# Patient Record
Sex: Female | Born: 1995 | Race: White | Hispanic: No | Marital: Single | Smoking: Never smoker
Health system: Southern US, Community
[De-identification: ages and names within clinical notes are randomized; demographics above are authoritative.]

## PROBLEM LIST (undated history)

## (undated) DIAGNOSIS — N83209 Unspecified ovarian cyst, unspecified side: Secondary | ICD-10-CM

## (undated) HISTORY — DX: Unspecified ovarian cyst, unspecified side: N83.209

---
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
## 2014-11-19 ENCOUNTER — Emergency Department (HOSPITAL_COMMUNITY)
Admission: EM | Admit: 2014-11-19 | Discharge: 2014-11-19 | Disposition: A | Discharge status: Home / Self Care | Payer: Federal, State, Local not specified - PPO | Attending: Emergency Medicine | Admitting: Emergency Medicine

## 2014-11-19 ENCOUNTER — Encounter (HOSPITAL_COMMUNITY): Payer: Self-pay | Admitting: Emergency Medicine

## 2014-11-19 ENCOUNTER — Emergency Department (HOSPITAL_COMMUNITY): Payer: Federal, State, Local not specified - PPO

## 2014-11-19 DIAGNOSIS — N830 Follicular cyst of ovary: Secondary | ICD-10-CM | POA: Insufficient documentation

## 2014-11-19 DIAGNOSIS — R1031 Right lower quadrant pain: Secondary | ICD-10-CM

## 2014-11-19 DIAGNOSIS — R1030 Lower abdominal pain, unspecified: Secondary | ICD-10-CM | POA: Diagnosis present

## 2014-11-19 DIAGNOSIS — R000 Tachycardia, unspecified: Secondary | ICD-10-CM | POA: Insufficient documentation

## 2014-11-19 DIAGNOSIS — N83201 Unspecified ovarian cyst, right side: Secondary | ICD-10-CM

## 2014-11-19 DIAGNOSIS — Z3202 Encounter for pregnancy test, result negative: Secondary | ICD-10-CM | POA: Insufficient documentation

## 2014-11-19 LAB — URINALYSIS, ROUTINE W REFLEX MICROSCOPIC
Bilirubin Urine: NEGATIVE
Glucose, UA: NEGATIVE mg/dL
Hgb urine dipstick: NEGATIVE
Ketones, ur: >80 mg/dL — AB
LEUKOCYTES UA: NEGATIVE
NITRITE: NEGATIVE
PH: 6 (ref 5.0–8.0)
Protein, ur: NEGATIVE mg/dL
SPECIFIC GRAVITY, URINE: 1.027 (ref 1.005–1.030)
Urobilinogen, UA: 1 mg/dL (ref 0.0–1.0)

## 2014-11-19 LAB — COMPREHENSIVE METABOLIC PANEL
ALT: 13 U/L — ABNORMAL LOW (ref 14–54)
ANION GAP: 9 (ref 5–15)
AST: 23 U/L (ref 15–41)
Albumin: 4.4 g/dL (ref 3.5–5.0)
Alkaline Phosphatase: 58 U/L (ref 38–126)
BUN: 7 mg/dL (ref 6–20)
CHLORIDE: 105 mmol/L (ref 101–111)
CO2: 25 mmol/L (ref 22–32)
Calcium: 9.7 mg/dL (ref 8.9–10.3)
Creatinine, Ser: 0.69 mg/dL (ref 0.44–1.00)
Glucose, Bld: 100 mg/dL — ABNORMAL HIGH (ref 65–99)
POTASSIUM: 3.6 mmol/L (ref 3.5–5.1)
Sodium: 139 mmol/L (ref 135–145)
Total Bilirubin: 1 mg/dL (ref 0.3–1.2)
Total Protein: 7.7 g/dL (ref 6.5–8.1)

## 2014-11-19 LAB — WET PREP, GENITAL
Trich, Wet Prep: NONE SEEN
YEAST WET PREP: NONE SEEN

## 2014-11-19 LAB — CBC
HEMATOCRIT: 39.2 % (ref 36.0–46.0)
HEMOGLOBIN: 13.4 g/dL (ref 12.0–15.0)
MCH: 31.6 pg (ref 26.0–34.0)
MCHC: 34.2 g/dL (ref 30.0–36.0)
MCV: 92.5 fL (ref 78.0–100.0)
Platelets: 195 10*3/uL (ref 150–400)
RBC: 4.24 MIL/uL (ref 3.87–5.11)
RDW: 12.2 % (ref 11.5–15.5)
WBC: 11.5 10*3/uL — AB (ref 4.0–10.5)

## 2014-11-19 LAB — LIPASE, BLOOD: Lipase: 109 U/L — ABNORMAL HIGH (ref 22–51)

## 2014-11-19 LAB — I-STAT BETA HCG BLOOD, ED (MC, WL, AP ONLY)

## 2014-11-19 MED ORDER — HYDROCODONE-ACETAMINOPHEN 5-325 MG PO TABS: 1.0000 | ORAL_TABLET | Freq: Four times a day (QID) | ORAL | Status: DC | PRN

## 2014-11-19 MED ORDER — IBUPROFEN 600 MG PO TABS: 600.0000 mg | ORAL_TABLET | Freq: Four times a day (QID) | ORAL | Status: DC | PRN

## 2014-11-19 MED ORDER — MORPHINE SULFATE (PF) 4 MG/ML IV SOLN: 5.0000 mg | Freq: Once | INTRAVENOUS | Status: DC

## 2014-11-19 MED ORDER — MORPHINE SULFATE (PF) 4 MG/ML IV SOLN
4.0000 mg | Freq: Once | INTRAVENOUS | Status: AC
  Administered 2014-11-19: 4 mg via INTRAVENOUS
  Filled 2014-11-19: qty 1

## 2014-11-19 MED ORDER — ONDANSETRON HCL 4 MG/2ML IJ SOLN
4.0000 mg | Freq: Once | INTRAMUSCULAR | Status: AC
  Administered 2014-11-19: 4 mg via INTRAVENOUS
  Filled 2014-11-19: qty 2

## 2014-11-19 MED ORDER — ONDANSETRON 4 MG PO TBDP: 4.0000 mg | ORAL_TABLET | Freq: Three times a day (TID) | ORAL | Status: DC | PRN

## 2014-11-19 NOTE — ED Notes (Signed)
Patient states she was having sex last night when she started having intense RLQ pain that lasted over an hour and caused her to have nausea, vomiting and diarrhea.  Patient states has eased off some at this time.   Patient denies other symptoms.

## 2014-11-19 NOTE — ED Notes (Signed)
[place] called and state that someone is coming to [facility] to [place] pt.

## 2014-11-19 NOTE — ED Provider Notes (Signed)
Patient presented to the ER with right lower quadrant and pelvic pain. Pain started suddenly during intercourse last night.  Face to face Exam: HEENT - PERRLA Lungs - CTAB Heart - RRR, no M/R/G Abd - S/NT/ND no tenderness at McBurney's point. Neuro - alert, oriented x3  Plan: Patient presents with pain and mild tenderness in the right lower pelvic area, not at McBurney's point. Symptoms began suddenly while having intercourse. Will perform pelvic exam to evaluate for infectious etiology. Ultrasound to rule out torsion.  Gilda Crease, MD 11/19/14 1945

## 2014-11-19 NOTE — ED Provider Notes (Signed)
CSN: [id number]     Arrival date & time 11/19/14  1048 History   First MD Initiated Contact with Patient 11/19/14 1716     Chief Complaint  Patient presents with  . Abdominal Pain    Patient is a 19 y.o. female presenting with general illness. The history is provided by the patient. No language interpreter was used.  Illness Location:  RLQ/Suprapubic Quality:  Pain Severity:  Severe Onset quality:  Sudden Timing:  Constant Progression:  Partially resolved Chronicity:  New Context:  PMHx presenting with abdominal pain. Onset yesterday evening at 10pm during sexual intercourse. Patient had hip flexed and hyper externally rotated with sudden onset  RLQ pain. Denies inguinal or thigh pain. Associated w/ N/V/D. Patient ate pizza bites for dinner that evening & boyfriend reportedly ate same without summer symptoms. Emesis NBNB. Diarrhea loose and non-bloody. Denies fever, chills, cough, SOB, CP, hematochezia, hematemesis, hematuria, dysuria, urinary frequency, urinary urgency, VB, vag d/c Associated symptoms: abdominal pain, diarrhea, nausea and vomiting   Associated symptoms: no chest pain, no congestion, no cough, no fever, no headaches, no rhinorrhea and no shortness of breath     History reviewed. No pertinent past medical history. History reviewed. No pertinent past surgical history. No family history on file. Social History  Substance Use Topics  . Smoking status: Never Smoker   . Smokeless tobacco: None  . Alcohol Use: No   OB History    No data available      Review of Systems  Constitutional: Negative for fever and chills.  HENT: Negative for congestion and rhinorrhea.   Respiratory: Negative for cough and shortness of breath.   Cardiovascular: Negative for chest pain.  Gastrointestinal: Positive for nausea, vomiting, abdominal pain and diarrhea. Negative for constipation, blood in stool, abdominal distention, anal bleeding and rectal pain.  Genitourinary: Negative for  dysuria, urgency, frequency and hematuria.  Neurological: Negative for headaches.  All other systems reviewed and are negative.   Allergies  Review of patient's allergies indicates no known allergies.  Home Medications   Prior to Admission medications   Medication Sig Start Date End Date Taking? Authorizing Provider  HYDROcodone-acetaminophen (NORCO/VICODIN) 5-325 MG per tablet Take 1 tablet by mouth every 6 (six) hours as needed. 11/19/14   Angelina Ok, MD  ibuprofen (ADVIL,MOTRIN) 600 MG tablet Take 1 tablet (600 mg total) by mouth every 6 (six) hours as needed. 11/19/14   Angelina Ok, MD  ondansetron (ZOFRAN ODT) 4 MG disintegrating tablet Take 1 tablet (4 mg total) by mouth every 8 (eight) hours as needed for nausea or vomiting. 11/19/14   Angelina Ok, MD   BP 122/71 mmHg  Pulse 87  Temp(Src) 98.6 F (37 C) (Oral)  Resp 16  Ht 5\' 4"  (1.626 m)  Wt 120 lb (54.432 kg)  BMI 20.59 kg/m2  SpO2 96%  LMP 11/01/2014   Physical Exam  Constitutional: She is oriented to person, place, and time. She appears well-developed and well-nourished. She appears distressed.  HENT:  Head: Normocephalic and atraumatic.  Eyes: Conjunctivae are normal. Pupils are equal, round, and reactive to light.  Neck: Normal range of motion.  Cardiovascular: Regular rhythm and intact distal pulses.  Tachycardia present.   Pulmonary/Chest: Effort normal and breath sounds normal. No respiratory distress. She has no wheezes.  Abdominal: Soft. Bowel sounds are normal. She exhibits no distension. There is tenderness (suprapubic region, RLQ). There is no rebound and no guarding.  Genitourinary:  GU exam notable for no blood in vault or  at cervical os, no cervical lesions, no CMT, no adnexal tenderness.  Musculoskeletal: Normal range of motion.  Neurological: She is alert and oriented to person, place, and time.  Skin: Skin is warm and dry. She is not diaphoretic.  Nursing note and vitals reviewed.   ED  Course  Procedures   Labs Review Labs Reviewed  WET PREP, GENITAL - Abnormal; Notable for the following:    Clue Cells Wet Prep HPF POC FEW (*)    WBC, Wet Prep HPF POC FEW (*)    All other components within normal limits  LIPASE, BLOOD - Abnormal; Notable for the following:    Lipase 109 (*)    All other components within normal limits  COMPREHENSIVE METABOLIC PANEL - Abnormal; Notable for the following:    Glucose, Bld 100 (*)    ALT 13 (*)    All other components within normal limits  CBC - Abnormal; Notable for the following:    WBC 11.5 (*)    All other components within normal limits  URINALYSIS, ROUTINE W REFLEX MICROSCOPIC (NOT AT [facility]) - Abnormal; Notable for the following:    Color, Urine AMBER (*)    Ketones, ur >80 (*)    All other components within normal limits  I-STAT BETA HCG BLOOD, ED ([facility], [facility], [facility] ONLY)  GC/CHLAMYDIA PROBE AMP ([place]) NOT AT [facility]   Imaging Review US Transvaginal Non-ob  11/19/2014   CLINICAL DATA:  Right lower quadrant pain  EXAM: TRANSABDOMINAL AND TRANSVAGINAL ULTRASOUND OF PELVIS  DOPPLER ULTRASOUND OF OVARIES  TECHNIQUE: Both transabdominal and transvaginal ultrasound examinations of the pelvis were performed. Transabdominal technique was performed for global imaging of the pelvis including uterus, ovaries, adnexal regions, and pelvic cul-de-sac.  It was necessary to proceed with endovaginal exam following the transabdominal exam to visualize the uterus and bilateral ovaries. Color and duplex Doppler ultrasound was utilized to evaluate blood flow to the ovaries.  COMPARISON:  None.  FINDINGS: Uterus  Measurements: 7.1 x 4.1 x 5.3 cm. No fibroids or other mass visualized.  Endometrium  Thickness: 11 mm.  No focal abnormality visualized.  Right ovary  Measurements: 3.7 x 3.5 x 4.8 cm. 3.1 x 2.3 x 2.8 cm corpus luteal cyst.  Left ovary  Measurements: 3.5 x 2.1 x 2.2 cm. Normal appearance/no adnexal mass.  Pulsed Doppler evaluation of both ovaries  demonstrates normal low-resistance arterial and venous waveforms.  Other findings  No free fluid.  IMPRESSION: 3.1 cm right corpus luteal cyst.  Otherwise negative pelvic ultrasound.  No evidence of ovarian torsion.   Electronically Signed   By: Charline Bills M.D.   On: 11/19/2014 21:03   US Pelvis Complete  11/19/2014   CLINICAL DATA:  Right lower quadrant pain  EXAM: TRANSABDOMINAL AND TRANSVAGINAL ULTRASOUND OF PELVIS  DOPPLER ULTRASOUND OF OVARIES  TECHNIQUE: Both transabdominal and transvaginal ultrasound examinations of the pelvis were performed. Transabdominal technique was performed for global imaging of the pelvis including uterus, ovaries, adnexal regions, and pelvic cul-de-sac.  It was necessary to proceed with endovaginal exam following the transabdominal exam to visualize the uterus and bilateral ovaries. Color and duplex Doppler ultrasound was utilized to evaluate blood flow to the ovaries.  COMPARISON:  None.  FINDINGS: Uterus  Measurements: 7.1 x 4.1 x 5.3 cm. No fibroids or other mass visualized.  Endometrium  Thickness: 11 mm.  No focal abnormality visualized.  Right ovary  Measurements: 3.7 x 3.5 x 4.8 cm. 3.1 x 2.3 x 2.8 cm corpus luteal  cyst.  Left ovary  Measurements: 3.5 x 2.1 x 2.2 cm. Normal appearance/no adnexal mass.  Pulsed Doppler evaluation of both ovaries demonstrates normal low-resistance arterial and venous waveforms.  Other findings  No free fluid.  IMPRESSION: 3.1 cm right corpus luteal cyst.  Otherwise negative pelvic ultrasound.  No evidence of ovarian torsion.   Electronically Signed   By: Charline Bills M.D.   On: 11/19/2014 21:03   [place] Art/ven Flow Abd Pelv Doppler  11/19/2014   CLINICAL DATA:  Right lower quadrant pain  EXAM: TRANSABDOMINAL AND TRANSVAGINAL ULTRASOUND OF PELVIS  DOPPLER ULTRASOUND OF OVARIES  TECHNIQUE: Both transabdominal and transvaginal ultrasound examinations of the pelvis were performed. Transabdominal technique was performed for global  imaging of the pelvis including uterus, ovaries, adnexal regions, and pelvic cul-de-sac.  It was necessary to proceed with endovaginal exam following the transabdominal exam to visualize the uterus and bilateral ovaries. Color and duplex Doppler ultrasound was utilized to evaluate blood flow to the ovaries.  COMPARISON:  None.  FINDINGS: Uterus  Measurements: 7.1 x 4.1 x 5.3 cm. No fibroids or other mass visualized.  Endometrium  Thickness: 11 mm.  No focal abnormality visualized.  Right ovary  Measurements: 3.7 x 3.5 x 4.8 cm. 3.1 x 2.3 x 2.8 cm corpus luteal cyst.  Left ovary  Measurements: 3.5 x 2.1 x 2.2 cm. Normal appearance/no adnexal mass.  Pulsed Doppler evaluation of both ovaries demonstrates normal low-resistance arterial and venous waveforms.  Other findings  No free fluid.  IMPRESSION: 3.1 cm right corpus luteal cyst.  Otherwise negative pelvic ultrasound.  No evidence of ovarian torsion.   Electronically Signed   By: Charline Bills M.D.   On: 11/19/2014 21:03   I have personally reviewed and evaluated these images and lab results as part of my medical decision-making.   EKG Interpretation None      MDM  Ms. Bensen is a 19 yo female w/ no notable PMHx presenting with abdominal pain. Onset yesterday evening at 10pm during sexual intercourse. Patient had hip flexed and hyper externally rotated with sudden onset  RLQ pain. Denies inguinal or thigh pain. Associated w/ N/V/D. Patient ate pizza bites for dinner that evening & boyfriend reportedly ate same without summer symptoms. Emesis NBNB. Diarrhea loose and non-bloody. Denies fever, chills, cough, SOB, CP, hematochezia, hematemesis, hematuria, dysuria, urinary frequency, urinary urgency, vaginal bleeding, vaginal discharge, recent travel, recent antibiotic use, or sick contacts.  Exam above notable for a young female lying in stretcher in mild distress secondary to pain. Afebrile. Heart rate 70s to 90s. Normotensive. Abdominal exam notable  for tenderness to palpation of lower quadrants worse in suprapubic and right lower quadrant region and no guarding or rebound. No CVA tenderness. GU exam notable for no blood in vault or at cervical os, no cervical lesions, no CMT, no adnexal tenderness.  Serum hCG undetectable. UA negative for blood or infection or severe dehydration. CMP unremarkable. Lipase 109. The CBC 11.5. Hemoglobin 13.4. Complete transabdominal and transvaginal ultrasound with Doppler showing no evidence of ovarian torsion, tubo-ovarian abscess, or other acute pathology but noting a right corpus luteal cyst.  Pain likely related to above noted ovarian cyst. Discussed the above findings with patient. Patient given a prescription for Zofran SL, Norco, and Ibuprofen. Patient discharged home in stable condition. Patient has appointment with her OB/GYN on Thursday (x3 days). Strict ED return precautions discussed. Patient and family understand and agree with plan of further questions concerning this time.  Pt care discussed  with and followed by my attending, Dr. Greggory Keen, MD Pager [phone]  Final diagnoses:  RLQ abdominal pain  Right ovarian cyst    Angelina Ok, MD 11/19/14 2325  Gilda Crease, MD 11/27/14 [phone]

## 2014-11-20 LAB — GC/CHLAMYDIA PROBE AMP (~~LOC~~) NOT AT ARMC
CHLAMYDIA, DNA PROBE: NEGATIVE
Neisseria Gonorrhea: NEGATIVE

## 2014-11-21 ENCOUNTER — Telehealth: Payer: Self-pay | Admitting: *Deleted

## 2014-11-21 NOTE — Telephone Encounter (Signed)
Has appt in am, will discuss then,this was just [facility]

## 2014-11-22 ENCOUNTER — Ambulatory Visit (INDEPENDENT_AMBULATORY_CARE_PROVIDER_SITE_OTHER): Payer: Federal, State, Local not specified - PPO | Admitting: Adult Health

## 2014-11-22 ENCOUNTER — Encounter: Payer: Self-pay | Admitting: Adult Health

## 2014-11-22 ENCOUNTER — Telehealth: Payer: Self-pay | Admitting: Adult Health

## 2014-11-22 ENCOUNTER — Ambulatory Visit (HOSPITAL_COMMUNITY)
Admission: RE | Admit: 2014-11-22 | Discharge: 2014-11-22 | Disposition: A | Discharge status: Home / Self Care | Payer: Federal, State, Local not specified - PPO | Source: Ambulatory Visit | Attending: Adult Health | Admitting: Adult Health

## 2014-11-22 VITALS — BP 120/80 | HR 72 | Ht 64.0 in | Wt 130.0 lb

## 2014-11-22 DIAGNOSIS — R197 Diarrhea, unspecified: Secondary | ICD-10-CM | POA: Insufficient documentation

## 2014-11-22 DIAGNOSIS — R112 Nausea with vomiting, unspecified: Secondary | ICD-10-CM | POA: Diagnosis not present

## 2014-11-22 DIAGNOSIS — R1011 Right upper quadrant pain: Secondary | ICD-10-CM

## 2014-11-22 DIAGNOSIS — N8320 Unspecified ovarian cysts: Secondary | ICD-10-CM | POA: Diagnosis not present

## 2014-11-22 DIAGNOSIS — N83209 Unspecified ovarian cyst, unspecified side: Secondary | ICD-10-CM

## 2014-11-22 DIAGNOSIS — N83201 Unspecified ovarian cyst, right side: Secondary | ICD-10-CM

## 2014-11-22 HISTORY — DX: Unspecified ovarian cyst, unspecified side: N83.209

## 2014-11-22 NOTE — Progress Notes (Signed)
Subjective:     Patient ID: Laura Barron, female   DOB: 04/22/1995, 19 y.o.   MRN: [id number]  HPI Laura Barron is a 19 year old white female, new to this practice, in for follow up of ER visit, for pelvic pain after sex and found to have ovarian cyst.  Review of Systems Patient denies any headaches, hearing loss, fatigue, blurred vision, shortness of breath, chest pain, problems with bowel movements, urination, or intercourse. No joint pain or mood swings.See HPI for positives. Reviewed past medical,surgical, social and family history. Reviewed medications and allergies.     Objective:   Physical Exam BP 120/80 mmHg  Pulse 72  Ht  (1.626 m)  Wt 130 lb (58.968 kg)  BMI 22.30 kg/m2  LMP 10/25/2014 Skin warm and dry. Neck: mid line trachea, normal thyroid, good ROM, no lymphadenopathy noted. Lungs: clear to ausculation bilaterally. Cardiovascular: regular rate and rhythm.Abdomen soft and non tender today,no HSM, reviewed [place] and labs from ER visit.She is aware probably ovulation cyst and that GC/CHL negative, and that lipase elevated, if has RUQ pain or vomiting let me know will get RUQ [place] to r/o gallstones.Face time 20 minutes with 50% counseling.    Assessment:     Right ovarian cyst    Plan:    Take prenatal vitamin with folic acid, since not using birth control Return in 4 weeks for pap and physical

## 2014-11-22 NOTE — Telephone Encounter (Signed)
Pt now complains of pain in right side and nausea,vomiting and diarrhea, will get abd [place] now at [facility]

## 2014-11-22 NOTE — Patient Instructions (Signed)
Return in 4 weeks for pap and physical 

## 2014-12-19 ENCOUNTER — Other Ambulatory Visit (HOSPITAL_COMMUNITY)
Admission: RE | Admit: 2014-12-19 | Discharge: 2014-12-19 | Disposition: A | Discharge status: Home / Self Care | Payer: Federal, State, Local not specified - PPO | Source: Ambulatory Visit | Attending: Obstetrics and Gynecology | Admitting: Obstetrics and Gynecology

## 2014-12-19 ENCOUNTER — Encounter: Payer: Self-pay | Admitting: Adult Health

## 2014-12-19 ENCOUNTER — Ambulatory Visit (INDEPENDENT_AMBULATORY_CARE_PROVIDER_SITE_OTHER): Payer: Federal, State, Local not specified - PPO | Admitting: Adult Health

## 2014-12-19 VITALS — BP 110/78 | HR 80 | Ht 63.5 in | Wt 125.0 lb

## 2014-12-19 DIAGNOSIS — Z01419 Encounter for gynecological examination (general) (routine) without abnormal findings: Secondary | ICD-10-CM

## 2014-12-19 DIAGNOSIS — Z01411 Encounter for gynecological examination (general) (routine) with abnormal findings: Secondary | ICD-10-CM | POA: Diagnosis present

## 2014-12-19 NOTE — Progress Notes (Signed)
Patient ID: Laura Barron, female   DOB: 02/19/1996, 19 y.o.   MRN: [id number] History of Present Illness:  Laura Barron is a 19 year old white female, in for well woman gyn exam and pap.No complaints today.  Current Medications, Allergies, Past Medical History, Past Surgical History, Family History and Social History were reviewed in [facility] electronic medical record.     Review of Systems: Patient denies any headaches, hearing loss, fatigue, blurred vision, shortness of breath, chest pain, abdominal pain, problems with bowel movements, urination, or intercourse. No joint pain or mood swings.    Physical Exam:BP 110/78 mmHg  Pulse 80  Ht 5' 3.5" (1.613 m)  Wt 125 lb (56.7 kg)  BMI 21.79 kg/m2  LMP 11/25/2014 General:  Well developed, well nourished, no acute distress Skin:  Warm and dry Neck:  Midline trachea, normal thyroid, good ROM, no lymphadenopathy Lungs; Clear to auscultation bilaterally Breast:  No dominant palpable mass, retraction, or nipple discharge Cardiovascular: Regular rate and rhythm Abdomen:  Soft, non tender, no hepatosplenomegaly Pelvic:  External genitalia is normal in appearance, no lesions.  The vagina is normal in appearance. Urethra has no lesions or masses. The cervix is smooth,and nulliparous,+ ovulatory mucous,pap peformed.  Uterus is felt to be normal size, shape, and contour.  No adnexal masses or tenderness noted.Bladder is non tender, no masses felt. Extremities/musculoskeletal:  No swelling or varicosities noted, no clubbing or cyanosis Psych:  No mood changes, alert and cooperative,seems happy   Impression: Well woman gyn exam with pap    Plan: Take OTC prenatal vitamin with folic acid Physical in 1 year, pap in 2 if normal

## 2014-12-19 NOTE — Patient Instructions (Signed)
Physical in 1 year Take prenatal with folic acid

## 2014-12-20 LAB — CYTOLOGY - PAP

## 2016-05-18 ENCOUNTER — Emergency Department (HOSPITAL_COMMUNITY): Payer: Federal, State, Local not specified - PPO

## 2016-05-18 ENCOUNTER — Encounter (HOSPITAL_COMMUNITY): Payer: Self-pay | Admitting: Emergency Medicine

## 2016-05-18 ENCOUNTER — Emergency Department (HOSPITAL_COMMUNITY)
Admission: EM | Admit: 2016-05-18 | Discharge: 2016-05-18 | Disposition: A | Discharge status: Home / Self Care | Payer: Federal, State, Local not specified - PPO | Attending: Emergency Medicine | Admitting: Emergency Medicine

## 2016-05-18 DIAGNOSIS — R102 Pelvic and perineal pain: Secondary | ICD-10-CM | POA: Diagnosis present

## 2016-05-18 DIAGNOSIS — N83299 Other ovarian cyst, unspecified side: Secondary | ICD-10-CM | POA: Insufficient documentation

## 2016-05-18 DIAGNOSIS — N83519 Torsion of ovary and ovarian pedicle, unspecified side: Secondary | ICD-10-CM

## 2016-05-18 DIAGNOSIS — N83209 Unspecified ovarian cyst, unspecified side: Secondary | ICD-10-CM

## 2016-05-18 LAB — CBC
HCT: 37.1 % (ref 36.0–46.0)
HEMOGLOBIN: 12.5 g/dL (ref 12.0–15.0)
MCH: 31.2 pg (ref 26.0–34.0)
MCHC: 33.7 g/dL (ref 30.0–36.0)
MCV: 92.5 fL (ref 78.0–100.0)
Platelets: 176 10*3/uL (ref 150–400)
RBC: 4.01 MIL/uL (ref 3.87–5.11)
RDW: 12 % (ref 11.5–15.5)
WBC: 8.7 10*3/uL (ref 4.0–10.5)

## 2016-05-18 LAB — URINALYSIS, ROUTINE W REFLEX MICROSCOPIC
Bilirubin Urine: NEGATIVE
GLUCOSE, UA: NEGATIVE mg/dL
Ketones, ur: NEGATIVE mg/dL
Leukocytes, UA: NEGATIVE
NITRITE: NEGATIVE
PH: 5 (ref 5.0–8.0)
Protein, ur: 30 mg/dL — AB
Specific Gravity, Urine: 1.026 (ref 1.005–1.030)

## 2016-05-18 LAB — POC URINE PREG, ED: Preg Test, Ur: NEGATIVE

## 2016-05-18 LAB — BASIC METABOLIC PANEL
Anion gap: 11 (ref 5–15)
BUN: 16 mg/dL (ref 6–20)
CHLORIDE: 105 mmol/L (ref 101–111)
CO2: 22 mmol/L (ref 22–32)
Calcium: 8.8 mg/dL — ABNORMAL LOW (ref 8.9–10.3)
Creatinine, Ser: 0.69 mg/dL (ref 0.44–1.00)
GFR calc Af Amer: >60 mL/min (ref >60)
GFR calc non Af Amer: >60 mL/min (ref >60)
GLUCOSE: 101 mg/dL — AB (ref 65–99)
POTASSIUM: 3.6 mmol/L (ref 3.5–5.1)
SODIUM: 138 mmol/L (ref 135–145)

## 2016-05-18 MED ORDER — KETOROLAC TROMETHAMINE 30 MG/ML IJ SOLN
30.0000 mg | Freq: Once | INTRAMUSCULAR | Status: AC
  Administered 2016-05-18: 30 mg via INTRAMUSCULAR

## 2016-05-18 MED ORDER — IBUPROFEN 600 MG PO TABS
600.0000 mg | ORAL_TABLET | Freq: Four times a day (QID) | ORAL | 0 refills | Status: AC | PRN
Start: 2016-05-18 — End: ?

## 2016-05-18 MED ORDER — KETOROLAC TROMETHAMINE 30 MG/ML IJ SOLN
30.0000 mg | Freq: Once | INTRAMUSCULAR | Status: DC
  Filled 2016-05-18: qty 1

## 2016-05-18 NOTE — ED Notes (Signed)
Patient transported to Ultrasound 

## 2016-05-18 NOTE — Discharge Instructions (Signed)
Please read and follow all provided instructions.  Your diagnoses today include:  1. Hemorrhagic ovarian cyst   2. No Ovarian torsion     Tests performed today include: Vital signs. See below for your results today.   Medications prescribed:  Take as prescribed   Home care instructions:  Follow any educational materials contained in this packet.  Follow-up instructions: Please follow-up with your OBGYN for further evaluation of symptoms and treatment   Return instructions:  Please return to the Emergency Department if you do not get better, if you get worse, or new symptoms OR  - Fever (temperature greater than 101.71F)  - Bleeding that does not stop with holding pressure to the area    -Severe pain (please note that you may be more sore the day after your accident)  - Chest Pain  - Difficulty breathing  - Severe nausea or vomiting  - Inability to tolerate food and liquids  - Passing out  - Skin becoming red around your wounds  - Change in mental status (confusion or lethargy)  - New numbness or weakness    Please return if you have any other emergent concerns.  Additional Information:  Your vital signs today were: BP 110/69 (BP Location: Right Arm)    Pulse 83    Temp 98.1 F (36.7 C) (Oral)    Resp 18    Ht 5\' 4"  (1.626 m)    Wt 59 kg    SpO2 98%    BMI 22.31 kg/m  If your blood pressure (BP) was elevated above 135/85 this visit, please have this repeated by your doctor within one month. ---------------

## 2016-05-18 NOTE — ED Triage Notes (Signed)
Pt sts lower abd pain and cramping with period that was severe and made her vomit this am

## 2016-05-18 NOTE — ED Provider Notes (Signed)
[facility]-EMER[facility]ENCY DEPT Provider Note   CSN: [id number] Arrival date & time: 05/18/16  0801     History   Chief Complaint Chief Complaint  Patient presents with  . Abdominal Pain    HPI Laura Barron is a 21 y.o. female.  HPI  21 y.o. female with a hx of Ovarian Cyst, presents to the Emergency Department today complaining of bilateral lower pelvic pain with onset this morning. Notes that when she was using the bathroom she felt a sharp stabbing sensation in her RLQ that caused her to double over and have 1 episode of emesis. Pt states that this has occurred in the past, and occurs every month with the onset of her menstrual cycles. States that the pain was so severe this morning that she called her mother and came to the hospital. Pt states that when she starts her cycle with bleeding, the pain stops. Rates pain 2/10 and a dull ache currently. No N/V/D since being in ED. No CP/SOB. No fevers. Pt is sexually active with one partner. Denies vaginal discharge. No other symptoms noted.    Past Medical History:  Diagnosis Date  . Ovarian cyst 11/22/2014    Patient Active Problem List   Diagnosis Date Noted  . Ovarian cyst 11/22/2014    History reviewed. No pertinent surgical history.  OB History    Gravida Para Term Preterm AB Living   0 0 0 0 0 0   SAB TAB Ectopic Multiple Live Births   0 0 0 0         Home Medications    Prior to Admission medications   Not on File    Family History Family History  Problem Relation Age of Onset  . Hypertension Mother   . Cancer Maternal Grandmother   . Cancer Maternal Grandfather     Social History Social History  Substance Use Topics  . Smoking status: Never Smoker  . Smokeless tobacco: Never Used  . Alcohol use No     Allergies   Patient has no known allergies.   Review of Systems Review of Systems ROS reviewed and all are negative for acute change except as noted in the HPI.  Physical Exam Updated Vital  Signs BP (!) 150/83 (BP Location: Right Arm)   Pulse 88   Temp 98.1 F (36.7 C) (Oral)   Resp 16   Ht 5\' 4"  (1.626 m)   Wt 59 kg   SpO2 100%   BMI 22.31 kg/m   Physical Exam  Constitutional: She is oriented to person, place, and time. Vital signs are normal. She appears well-developed and well-nourished.  NAD. Sitting comfortably   HENT:  Head: Normocephalic and atraumatic.  Right Ear: Hearing normal.  Left Ear: Hearing normal.  Eyes: Conjunctivae and EOM are normal. Pupils are equal, round, and reactive to light.  Neck: Normal range of motion. Neck supple.  Cardiovascular: Normal rate, regular rhythm, normal heart sounds and intact distal pulses.   Pulmonary/Chest: Effort normal and breath sounds normal.  Abdominal: Soft. Normal appearance and bowel sounds are normal. There is no tenderness.  Abdomen Soft. Diffuse mild tenderness across lower abdomen.   Musculoskeletal: Normal range of motion.  Neurological: She is alert and oriented to person, place, and time.  Skin: Skin is warm and dry.  Psychiatric: She has a normal mood and affect. Her speech is normal and behavior is normal. Thought content normal.  Nursing note and vitals reviewed.  ED Treatments / Results  Labs (all labs  ordered are listed, but only abnormal results are displayed) Labs Reviewed  URINALYSIS, ROUTINE W REFLEX MICROSCOPIC - Abnormal; Notable for the following:       Result Value   Hgb urine dipstick LARGE (*)    Protein, ur 30 (*)    Bacteria, UA RARE (*)    Squamous Epithelial / LPF 0-5 (*)    All other components within normal limits  BASIC METABOLIC PANEL - Abnormal; Notable for the following:    Glucose, Bld 101 (*)    Calcium 8.8 (*)    All other components within normal limits  CBC  POC URINE PREG, ED    EKG  EKG Interpretation None       Radiology [place] Transvaginal Non-ob  Result Date: 05/18/2016 CLINICAL DATA:  Pelvic pain EXAM: TRANSABDOMINAL AND TRANSVAGINAL ULTRASOUND OF  PELVIS DOPPLER ULTRASOUND OF OVARIES TECHNIQUE: Study was performed transabdominally to optimize pelvic field of view evaluation and transvaginally to optimize internal visceral architectural evaluation. Color and duplex Doppler ultrasound was utilized to evaluate blood flow to the ovaries. COMPARISON:  November 19, 2014 FINDINGS: Uterus Measurements: 7.5 x 4.6 x 5.4 cm. No fibroids or other mass visualized. Uterus is retropositioned. Endometrium Thickness: 7 mm.  No focal abnormality visualized. Right ovary Measurements: 4.7 x 2.3 x 2.7 cm. There is a mixed echogenicity focus in the right ovary measuring 2.9 x 2.6 x 2.7 cm, probably a hemorrhagic cyst. No other right-sided pelvic mass. Left ovary Measurements: 4.3 x 1.8 x 2.5 cm. There is a probable smaller hemorrhagic cyst on the left measuring 1.0 x 0.8 x 1.0 cm. Pulsed Doppler evaluation of both ovaries demonstrates normal low-resistance arterial and venous waveforms. Other findings No abnormal free fluid. IMPRESSION: Probable hemorrhagic cysts bilaterally, larger on the right than on the left. Short-interval follow up ultrasound in 6-12 weeks is recommended, preferably during the week following the patient's normal menses. No other pelvic masses. Endometrium is not thickened. Note the uterus is retroverted. No ovarian torsion on either side. Electronically Signed   By: Bretta BangWilliam  Woodruff III M.D.   On: 05/18/2016 10:11   [place] Pelvis Complete  Result Date: 05/18/2016 CLINICAL DATA:  Pelvic pain EXAM: TRANSABDOMINAL AND TRANSVAGINAL ULTRASOUND OF PELVIS DOPPLER ULTRASOUND OF OVARIES TECHNIQUE: Study was performed transabdominally to optimize pelvic field of view evaluation and transvaginally to optimize internal visceral architectural evaluation. Color and duplex Doppler ultrasound was utilized to evaluate blood flow to the ovaries. COMPARISON:  November 19, 2014 FINDINGS: Uterus Measurements: 7.5 x 4.6 x 5.4 cm. No fibroids or other mass visualized. Uterus is  retropositioned. Endometrium Thickness: 7 mm.  No focal abnormality visualized. Right ovary Measurements: 4.7 x 2.3 x 2.7 cm. There is a mixed echogenicity focus in the right ovary measuring 2.9 x 2.6 x 2.7 cm, probably a hemorrhagic cyst. No other right-sided pelvic mass. Left ovary Measurements: 4.3 x 1.8 x 2.5 cm. There is a probable smaller hemorrhagic cyst on the left measuring 1.0 x 0.8 x 1.0 cm. Pulsed Doppler evaluation of both ovaries demonstrates normal low-resistance arterial and venous waveforms. Other findings No abnormal free fluid. IMPRESSION: Probable hemorrhagic cysts bilaterally, larger on the right than on the left. Short-interval follow up ultrasound in 6-12 weeks is recommended, preferably during the week following the patient's normal menses. No other pelvic masses. Endometrium is not thickened. Note the uterus is retroverted. No ovarian torsion on either side. Electronically Signed   By: Bretta BangWilliam  Woodruff III M.D.   On: 05/18/2016 10:11   [place] Art/ven  Flow Abd Pelv Doppler  Result Date: 05/18/2016 CLINICAL DATA:  Pelvic pain EXAM: TRANSABDOMINAL AND TRANSVAGINAL ULTRASOUND OF PELVIS DOPPLER ULTRASOUND OF OVARIES TECHNIQUE: Study was performed transabdominally to optimize pelvic field of view evaluation and transvaginally to optimize internal visceral architectural evaluation. Color and duplex Doppler ultrasound was utilized to evaluate blood flow to the ovaries. COMPARISON:  November 19, 2014 FINDINGS: Uterus Measurements: 7.5 x 4.6 x 5.4 cm. No fibroids or other mass visualized. Uterus is retropositioned. Endometrium Thickness: 7 mm.  No focal abnormality visualized. Right ovary Measurements: 4.7 x 2.3 x 2.7 cm. There is a mixed echogenicity focus in the right ovary measuring 2.9 x 2.6 x 2.7 cm, probably a hemorrhagic cyst. No other right-sided pelvic mass. Left ovary Measurements: 4.3 x 1.8 x 2.5 cm. There is a probable smaller hemorrhagic cyst on the left measuring 1.0 x 0.8 x 1.0 cm.  Pulsed Doppler evaluation of both ovaries demonstrates normal low-resistance arterial and venous waveforms. Other findings No abnormal free fluid. IMPRESSION: Probable hemorrhagic cysts bilaterally, larger on the right than on the left. Short-interval follow up ultrasound in 6-12 weeks is recommended, preferably during the week following the patient's normal menses. No other pelvic masses. Endometrium is not thickened. Note the uterus is retroverted. No ovarian torsion on either side. Electronically Signed   By: Bretta Bang III M.D.   On: 05/18/2016 10:11    Procedures Procedures (including critical care time)  Medications Ordered in ED Medications  ketorolac (TORADOL) 30 MG/ML injection 30 mg (30 mg Intramuscular Given 05/18/16 0939)     Initial Impression / Assessment and Plan / ED Course  I have reviewed the triage vital signs and the nursing notes.  Pertinent labs & imaging results that were available during my care of the patient were reviewed by me and considered in my medical decision making (see chart for details).  Final Clinical Impressions(s) / ED Diagnoses  {I have reviewed and evaluated the relevant laboratory values. {I have reviewed and evaluated the relevant imaging studies.  {I have reviewed the relevant previous healthcare records.  {I obtained HPI from historian.   ED Course:  Assessment: Pt is a 21 y.o. female with hx Ovarian Cysts who presents with sharp lower abdominal pain with onest this morning. Pt states this occurs with onset of menstrual cycles every month. Notes worsening this AM. x1 episode emesis that resolved. Notes pain resolved with onset of vaginal bleeding. Minimal pain currently. Seen in past for same and Dx Ovarian Cyst. On exam, pt in NAD. Nontoxic/nonseptic appearing. VSS. Afebrile. Lungs CTA. Heart RRR. Abdomen with mild diffuse tenderness with palpation across lower abdomen. Pregnancy negative. Pelvic US to eval for Torsion showed a probable  hemorrhagic cyst. UA unremarkable. Given Toradol in ED with improvement of symptoms. Plan is to DC Home with follow up to GYN. At time of discharge, Patient is in no acute distress. Vital Signs are stable. Patient is able to ambulate. Patient able to tolerate PO.   Disposition/Plan:  Dc Home Additional Verbal discharge instructions given and discussed with patient.  Pt Instructed to f/u with GYN in the next week for evaluation and treatment of symptoms. Return precautions given Pt acknowledges and agrees with plan  Supervising Physician Jerelyn Scott, MD  Final diagnoses:  No Ovarian torsion  Hemorrhagic ovarian cyst    New Prescriptions New Prescriptions   No medications on file     Audry Pili, PA-C 05/18/16 1052    Jerelyn Scott, MD 05/18/16 1055

## 2016-05-25 ENCOUNTER — Telehealth: Payer: Self-pay | Admitting: *Deleted

## 2016-05-25 ENCOUNTER — Encounter: Payer: Self-pay | Admitting: Adult Health

## 2016-05-25 ENCOUNTER — Ambulatory Visit (INDEPENDENT_AMBULATORY_CARE_PROVIDER_SITE_OTHER): Payer: Federal, State, Local not specified - PPO | Admitting: Adult Health

## 2016-05-25 VITALS — BP 110/80 | HR 86 | Ht 64.0 in | Wt 117.5 lb

## 2016-05-25 DIAGNOSIS — N940 Mittelschmerz: Secondary | ICD-10-CM | POA: Diagnosis not present

## 2016-05-25 DIAGNOSIS — Z8742 Personal history of other diseases of the female genital tract: Secondary | ICD-10-CM | POA: Diagnosis not present

## 2016-05-25 MED ORDER — NORETHIN-ETH ESTRAD-FE BIPHAS 1 MG-10 MCG / 10 MCG PO TABS: 1.0000 | ORAL_TABLET | Freq: Every day | ORAL | 4 refills | Status: DC

## 2016-05-25 NOTE — Telephone Encounter (Signed)
Spoke with pt letting her know I [facility] [place] JSY

## 2016-05-25 NOTE — Patient Instructions (Signed)
Mittelschmerz Mittelschmerz is pain in the lower abdomen that is felt between periods.  The pain affects one side of the abdomen. The side that it affects may change from month to month.  The pain may be mild or severe.  The pain may last from minutes to hours. It does not last longer than 1-2 days.  The pain can happen with nausea and light vaginal bleeding. Mittelschmerz is common among women. It is caused by the growth and release of an egg from an ovary, and it is a natural part of the ovulation cycle. It often happens about two weeks after a woman's period ends. Follow these instructions at home: Pay attention to any changes in your condition. Take these actions to help with your pain:  Try soaking in a hot bath.  Take over-the-counter and prescription medicines only as told by your health care provider.  Keep all follow-up visits as told by your health care provider. This is important. Contact a health care provider if:  You have very bad pain most months.  You have abdominal pain that lasts longer than 24 hours.  Your pain medicine is not helping.  You have a fever.  You have nausea or vomiting that will not go away.  You miss your period.  You have vaginal bleeding between your periods that is heavier than spotting. This information is not intended to replace advice given to you by your health care provider. Make sure you discuss any questions you have with your health care provider. Document Released: 02/06/2002 Document Revised: 07/25/2015 Document Reviewed: 05/14/2014 Elsevier Interactive Patient Education  2017 [employer]

## 2016-05-25 NOTE — Progress Notes (Signed)
Subjective:     Patient ID: Laura Barron, female   DOB: 03/05/1995, 21 y.o.   MRN: [id number]  HPI Laura Standardllison is a 21 year old white female in for follow up of [facility] for sudden onset of pain in pelvic and area and had nausea and vomiting, seems to happen monthly before period.Periods are irregular, and would like to be pregnant.  Review of Systems Has pain before periods Reviewed past medical,surgical, social and family history. Reviewed medications and allergies.     Objective:   Physical Exam BP 110/80 (BP Location: Left Arm, Patient Position: Sitting, Cuff Size: Normal)   Pulse 86   Ht 5\' 4"  (1.626 m)   Wt 117 lb 8 oz (53.3 kg)   LMP 05/18/2016 (Exact Date)   BMI 20.17 kg/m    Skin and warm, abdomen is soft and non tender, no masses felt. Review labs and US from ER visit, UPT negative and US showed bilateral ovarian cysts, R>L and both appear hemorrhagic.  Discussed trying OCs for 3-4 months and get F/U US in about 8 weeks to assess ovaries.Then can talk about getting pregnant and she agrees. Face time 15 minutes with 50% counseling and coordinating care.  Assessment:     History of ovarian cysts   Ovulation pain  Plan:     Meds ordered this encounter  Medications  . Norethindrone-Ethinyl Estradiol-Fe Biphas (LO LOESTRIN FE) 1 MG-10 MCG / 10 MCG tablet    Sig: Take 1 tablet by mouth daily. Take 1 daily by mouth    Dispense:  3 Package    Refill:  4    BIN [id number], PCN [id number], GRP [id number],ID [id number]    Order Specific Question:   Supervising Provider    Answer:   Lazaro ArmsEURE, LUTHER H [[id number]]  Start lo loestrin today Return in 2 months for US and then see me about 3-4 days later for physical  Review handout on mittleschmerz

## 2016-07-22 ENCOUNTER — Other Ambulatory Visit: Payer: Self-pay | Admitting: Adult Health

## 2016-07-22 DIAGNOSIS — N83202 Unspecified ovarian cyst, left side: Principal | ICD-10-CM

## 2016-07-22 DIAGNOSIS — N83201 Unspecified ovarian cyst, right side: Secondary | ICD-10-CM

## 2016-07-24 ENCOUNTER — Ambulatory Visit (INDEPENDENT_AMBULATORY_CARE_PROVIDER_SITE_OTHER): Payer: Federal, State, Local not specified - PPO

## 2016-07-24 DIAGNOSIS — N83202 Unspecified ovarian cyst, left side: Secondary | ICD-10-CM

## 2016-07-24 DIAGNOSIS — N83201 Unspecified ovarian cyst, right side: Secondary | ICD-10-CM

## 2016-07-24 NOTE — Progress Notes (Signed)
PELVIC US TA/TV: homogeneous retroverted uterus,wnl,normal left ovary,simple right ovarian cyst 3 x 2.1 x 2.3 cm,EEC 5 mm,9.7 x 3.9 x 9.1 mm echogenic mass w/in the endometrium w/color flow (? Endometrial polyp),no free fluid,ovaries appear mobile,some pelvic discomfort during ultrasound,

## 2016-07-28 ENCOUNTER — Ambulatory Visit (INDEPENDENT_AMBULATORY_CARE_PROVIDER_SITE_OTHER): Payer: Federal, State, Local not specified - PPO | Admitting: Advanced Practice Midwife

## 2016-07-28 ENCOUNTER — Encounter: Payer: Self-pay | Admitting: Advanced Practice Midwife

## 2016-07-28 VITALS — BP 120/88 | HR 76 | Ht 62.4 in | Wt 123.0 lb

## 2016-07-28 DIAGNOSIS — N83201 Unspecified ovarian cyst, right side: Secondary | ICD-10-CM

## 2016-07-28 DIAGNOSIS — N926 Irregular menstruation, unspecified: Secondary | ICD-10-CM

## 2016-07-28 MED ORDER — NORETHIN-ETH ESTRAD-FE BIPHAS 1 MG-10 MCG / 10 MCG PO TABS: 1.0000 | ORAL_TABLET | Freq: Every day | ORAL | 4 refills | Status: DC

## 2016-07-28 NOTE — Progress Notes (Signed)
[facility] Visit  Patient name: Laura Barron MRN [id number]  Date of birth: [birth year]  CC & HPI:  Laura Barron is a 21 y.o. Caucasian female presenting today for review US from 5/25. Has been on COCs since March d/t painful bilateral hemorrhagic ovarian cysts. She had a repeat US 2 days ago which show only a right simple cyst, ~ 2cms.  Rarely has pain since being on the COCs.  Wants to get pregnant.  [facility], but periods are irregular and doesn't have sex "as often as I used to".  Boyfriend doesn't have any kids.    Pertinent History Reviewed:  Medical & Surgical Hx:   Past Medical History:  Diagnosis Date  . Ovarian cyst 11/22/2014   History reviewed. No pertinent surgical history. Family History  Problem Relation Age of Onset  . Hypertension Mother   . Cancer Maternal Grandmother   . Cancer Maternal Grandfather     Current Outpatient Prescriptions:  .  ibuprofen (ADVIL,MOTRIN) 600 MG tablet, Take 1 tablet (600 mg total) by mouth every 6 (six) hours as needed., Disp: 30 tablet, Rfl: 0 .  Norethindrone-Ethinyl Estradiol-Fe Biphas (LO LOESTRIN FE) 1 MG-10 MCG / 10 MCG tablet, Take 1 tablet by mouth daily. Take 1 daily by mouth, Disp: 3 Package, Rfl: 4 Social History: Reviewed -  reports that she has never smoked. She has never used smokeless tobacco.  Review of Systems:   Constitutional: Negative for fever and chills Eyes: Negative for visual disturbances Respiratory: Negative for shortness of breath, dyspnea Cardiovascular: Negative for chest pain or palpitations  Gastrointestinal: Negative for vomiting, diarrhea and constipation; no abdominal pain Genitourinary: Negative for dysuria and urgency, vaginal irritation or itching Musculoskeletal: Negative for back pain, joint pain, myalgias  Neurological: Negative for dizziness and headaches    Objective Findings:    Physical Examination: General appearance - well appearing, and in no  distress Mental status - alert, oriented to person, place, and time Chest:  Normal respiratory effort Heart - normal rate and regular rhythm Abdomen:  Soft, nontender Musculoskeletal:  Normal range of motion without pain Extremities:  No edema  50% or more of this visit was spent in counseling and coordination of care.  20 minutes of face to face time.   No results found for this or any previous visit (from the past 24 hour(s)).    Assessment & Plan:  A:   Ovarian cysts; desires pregnancy P:  Semen analysis for Devin clark 05/[birth year] ordered  If normal, use ovulation predictor kits  If not pregnant after 9 tries (using OV Pred Kits), consider clomid (aware that may make cysts worse).    Start PNV when stops COCs   Return for If you have any problems.  CRESENZO-DISHMAN,Qiana Landgrebe CNM 07/28/2016 11:52 AM

## 2017-02-16 IMAGING — US US ABDOMEN COMPLETE
1 series · 14 of 25 positions shown · non-contrast
Comparison: No priors.

CLINICAL DATA: 19-year-old female with right upper quadrant
abdominal pain, nausea and vomiting worsening over the past several
days. Diarrhea.

EXAM:
ULTRASOUND ABDOMEN COMPLETE

[Series 1: us abdomen complete · 0.13mm/px · 14 of 109 slices shown]
[im 1/109]
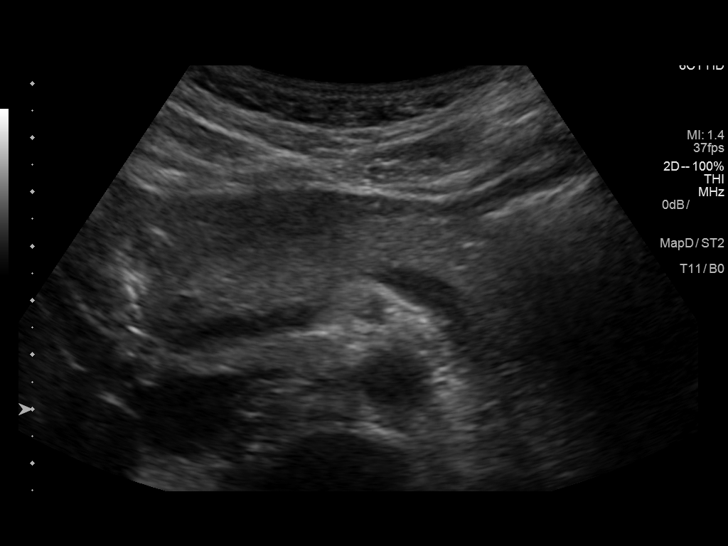
[im 10/109]
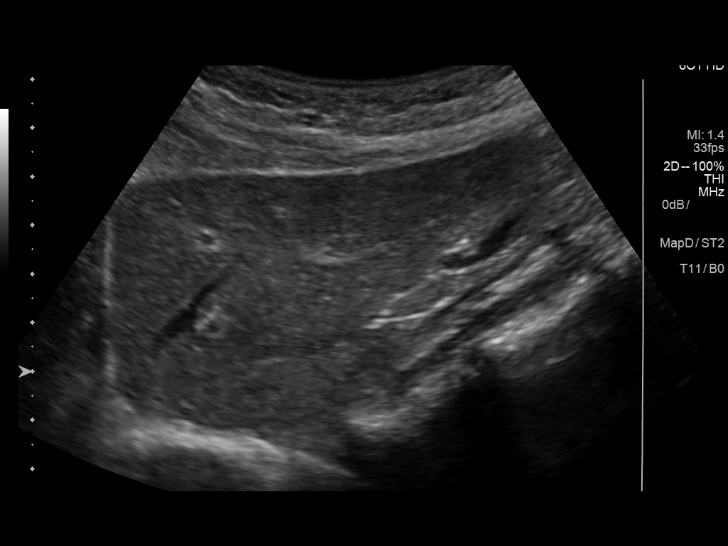
[im 19/109]
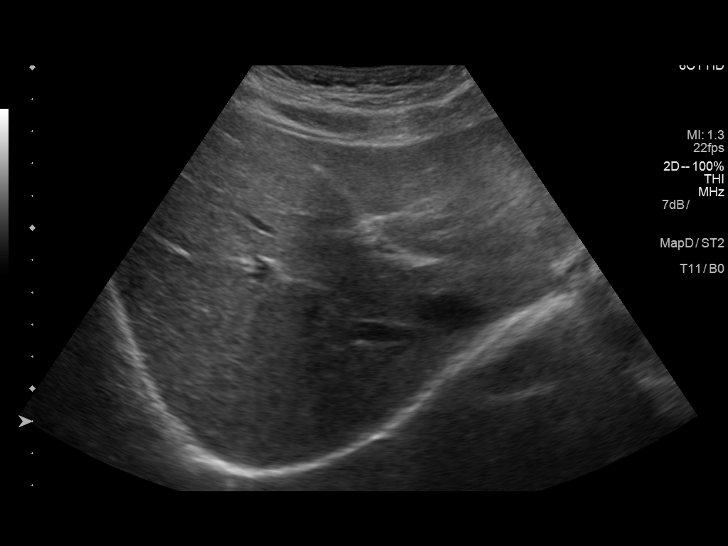
[im 28/109]
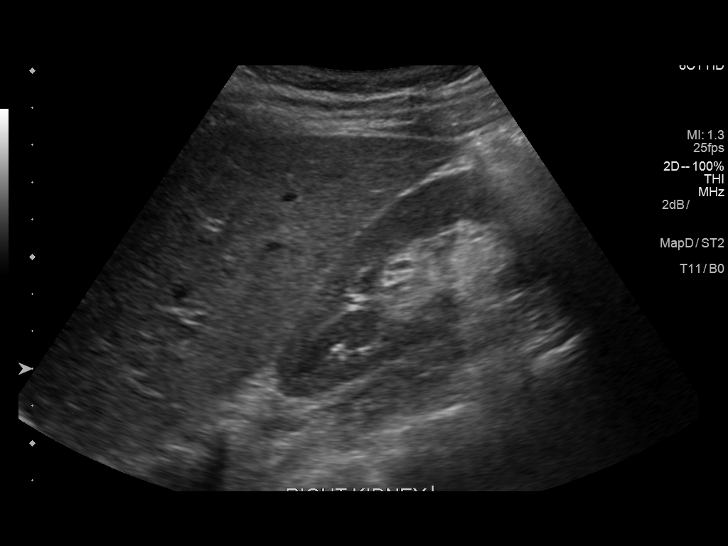
[im 37/109]
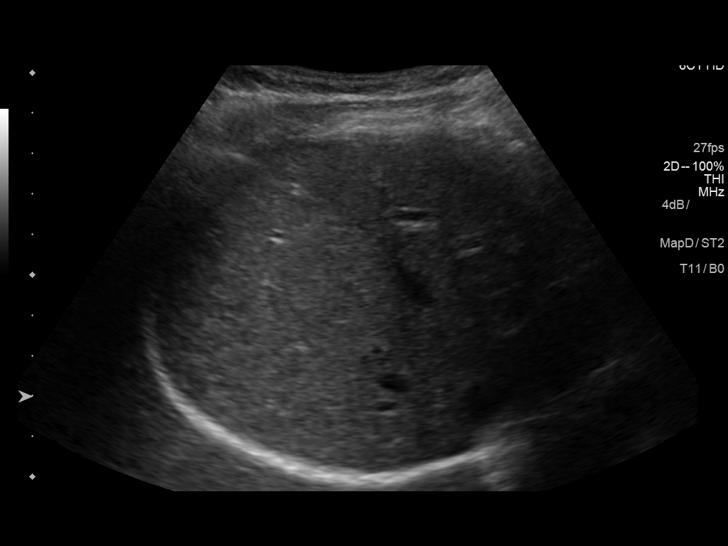
[im 41/109]
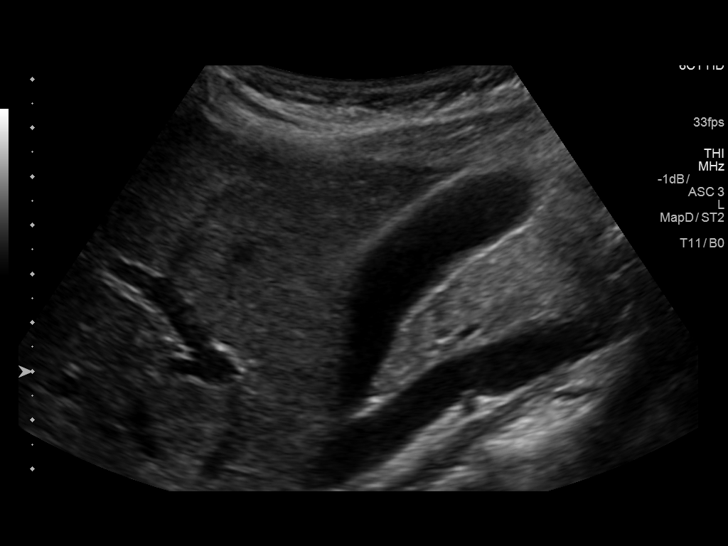
[im 50/109]
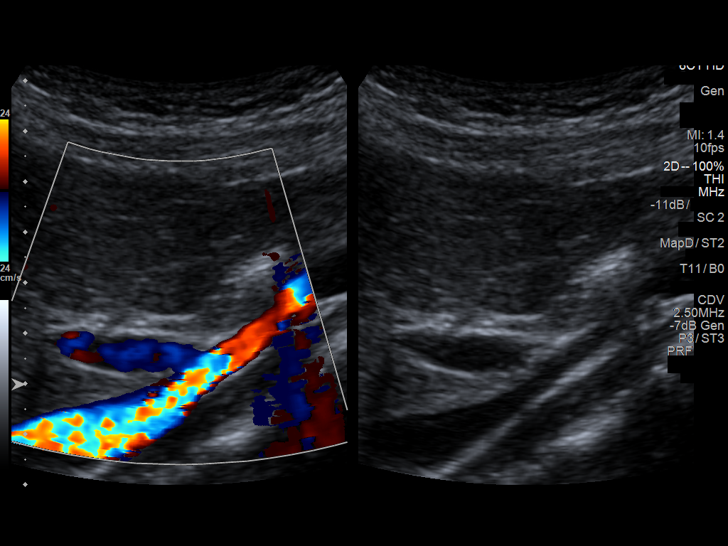
[im 59/109]
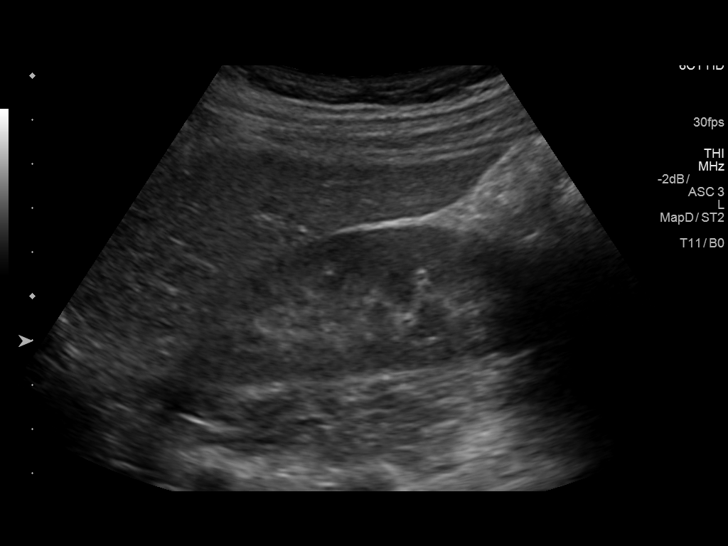
[im 68/109]
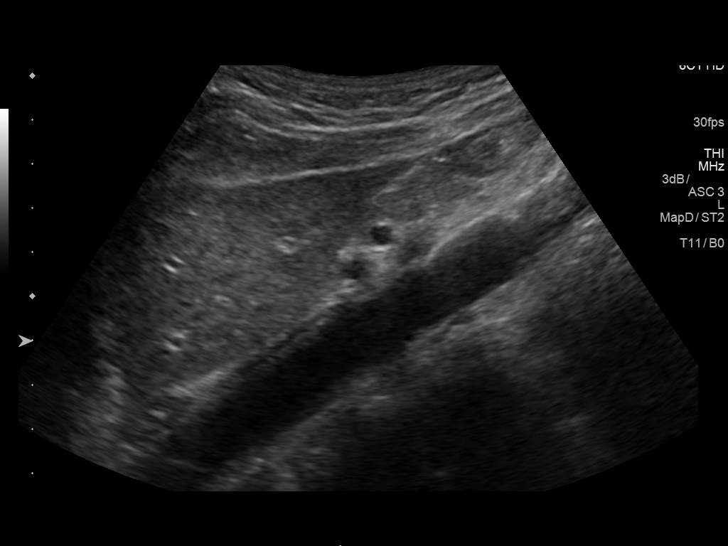
[im 73/109]
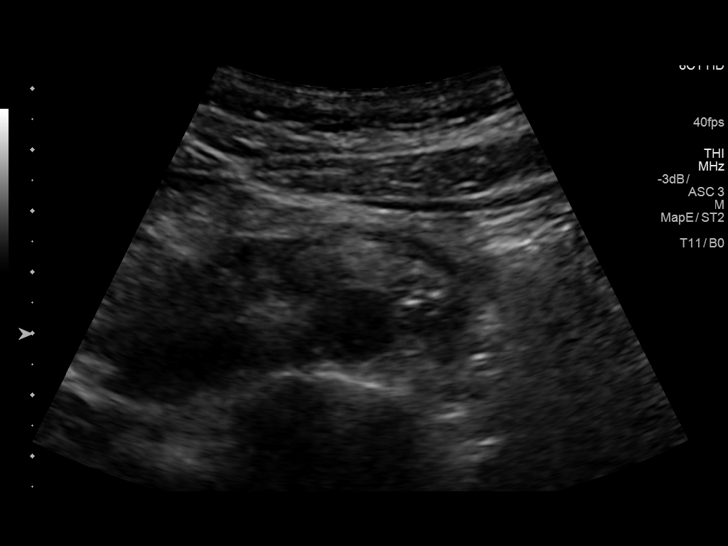
[im 82/109]
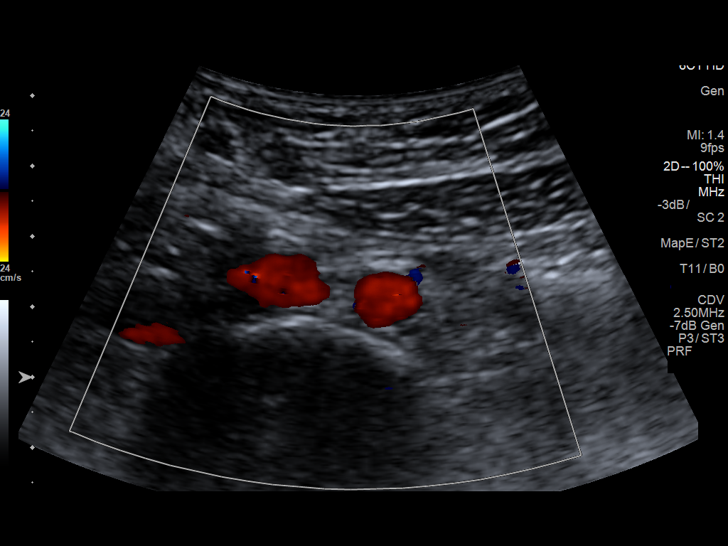
[im 91/109]
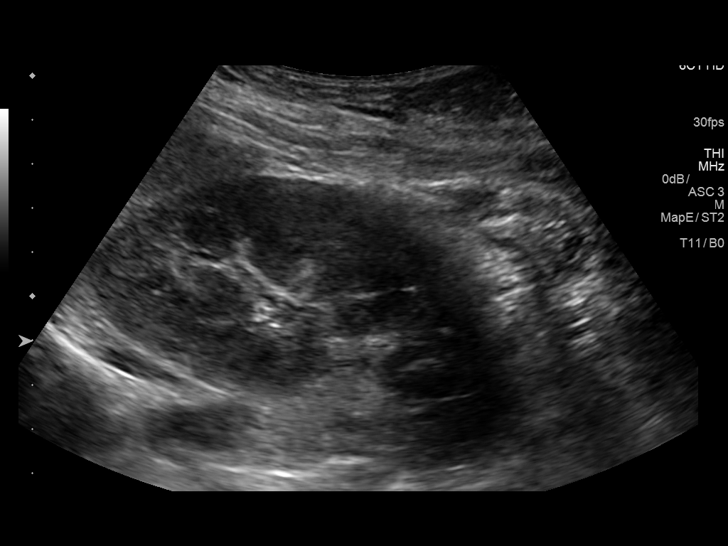
[im 100/109]
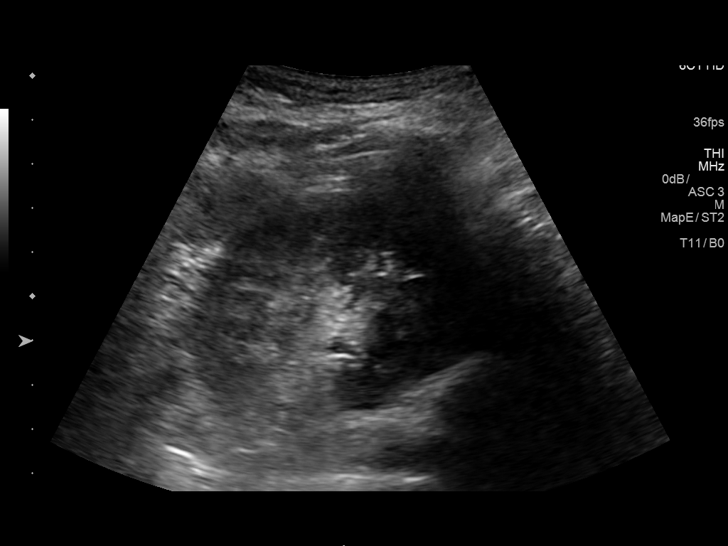
[im 109/109]
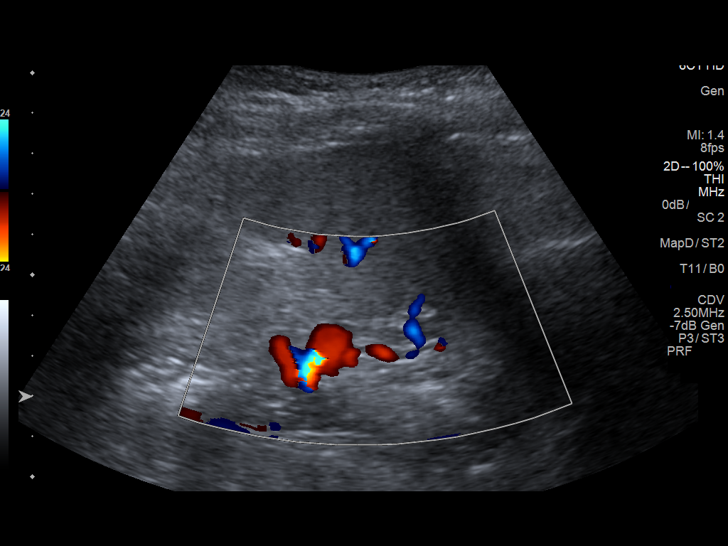

[14 of 25 positions shown; findings below may reference images not displayed]

FINDINGS: Gallbladder: No gallstones or wall thickening visualized. No
sonographic Murphy sign noted.

Common bile duct: Diameter: 3 mm in the porta hepatis.

Liver: No focal lesion identified. Within normal limits in
parenchymal echogenicity.

IVC: No abnormality visualized.

Pancreas: Visualized portion unremarkable.

Spleen: Size and appearance within normal limits.  9.9 cm in length.

Right Kidney: Length: 10.1 cm. Echogenicity within normal limits. No
mass or hydronephrosis visualized.

Left Kidney: Length: 10.5 cm. Echogenicity within normal limits. No
mass or hydronephrosis visualized.

Abdominal aorta: No aneurysm visualized.

Other findings: None.
IMPRESSION: 1. No acute findings in the abdomen to account for the patient's
symptoms.

## 2017-03-01 ENCOUNTER — Telehealth: Payer: Self-pay | Admitting: Adult Health

## 2017-03-01 NOTE — Telephone Encounter (Signed)
Informed patient [facility] [place] [facility] should be able to call them and have it transferred. Stated she had not tried that but would call.

## 2017-10-02 ENCOUNTER — Other Ambulatory Visit: Payer: Self-pay | Admitting: Advanced Practice Midwife

## 2018-08-13 IMAGING — US US TRANSVAGINAL NON-OB
1 series · 13 of 25 positions shown · non-contrast
Comparison: November 19, 2014

CLINICAL DATA: Pelvic pain

EXAM:
TRANSABDOMINAL AND TRANSVAGINAL ULTRASOUND OF PELVIS
DOPPLER ULTRASOUND OF OVARIES
TECHNIQUE: Study was performed transabdominally to optimize pelvic field of
view evaluation and transvaginally to optimize internal visceral
architectural evaluation.
Color and duplex Doppler ultrasound was utilized to evaluate blood
flow to the ovaries.

[Series 1: us transvaginal non-ob · 0.18mm/px · 13 of 100 slices shown]
[im 1/100]
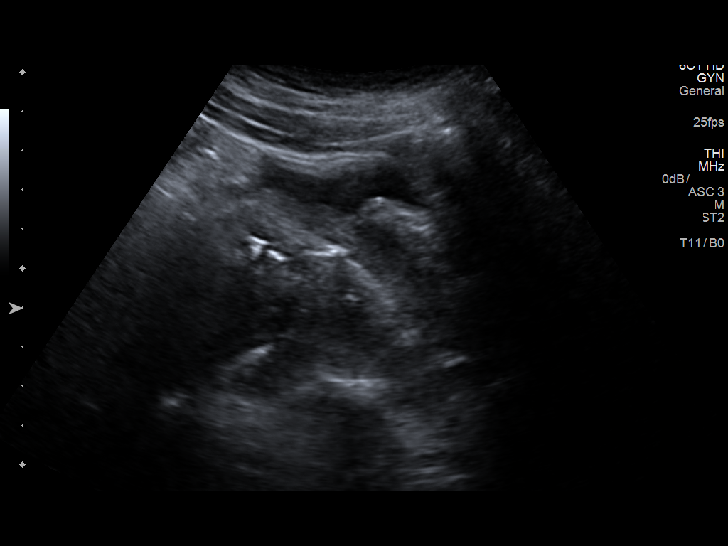
[im 9/100]
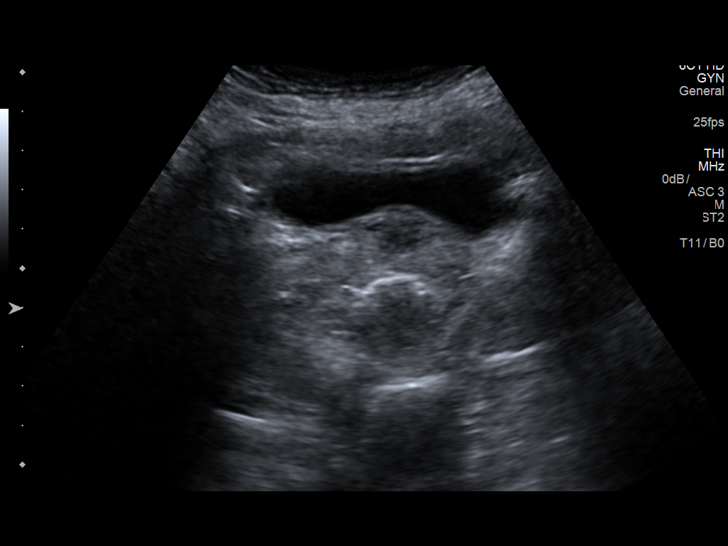
[im 17/100]
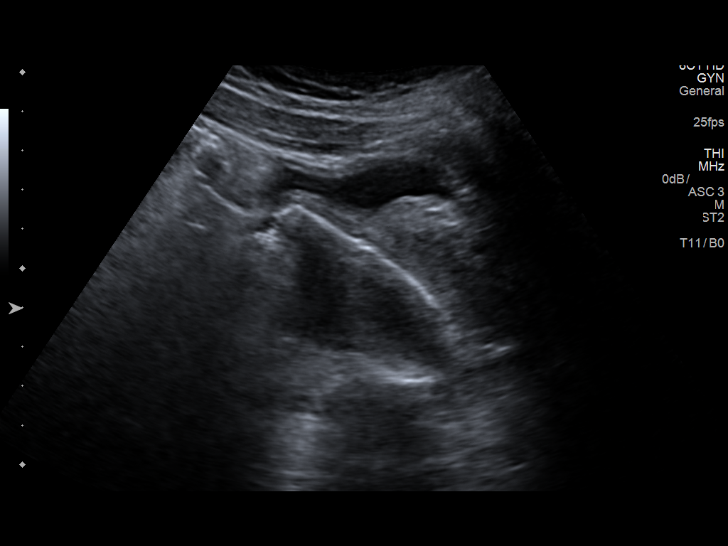
[im 25/100]
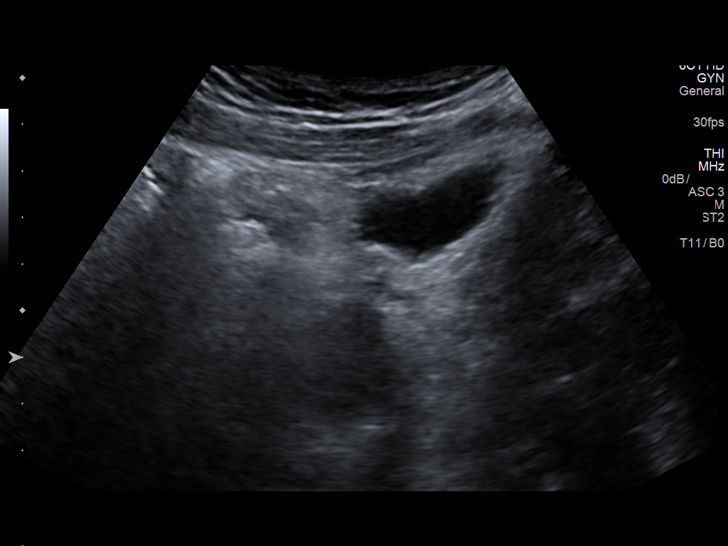
[im 34/100]
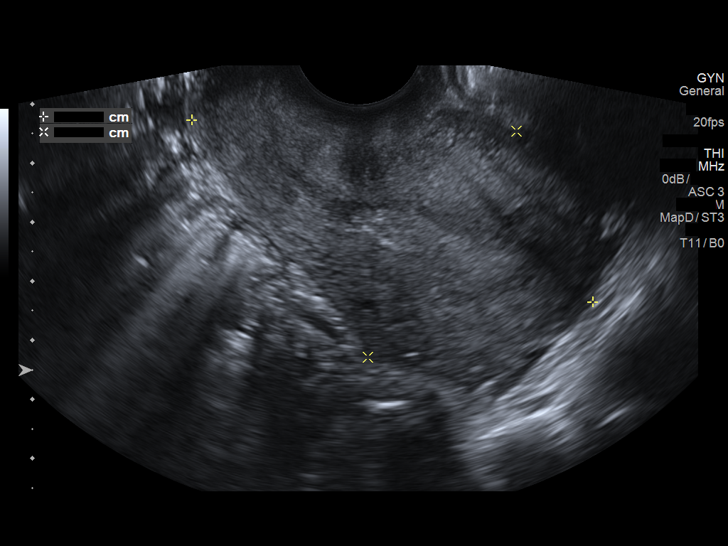
[im 42/100]
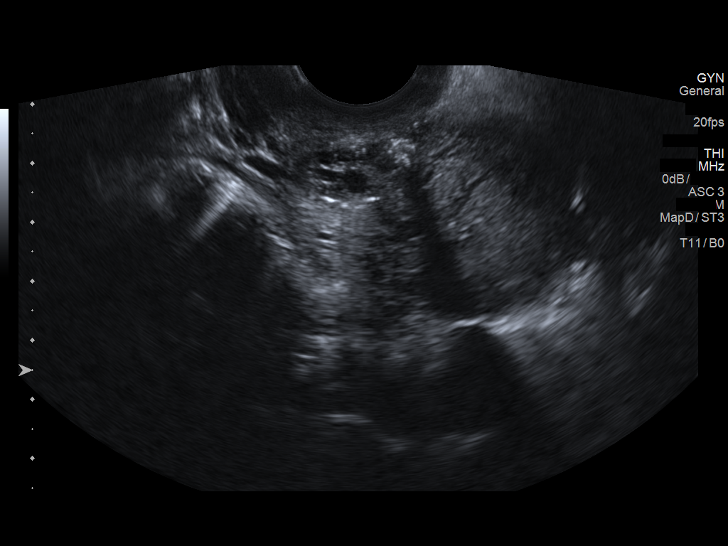
[im 50/100]
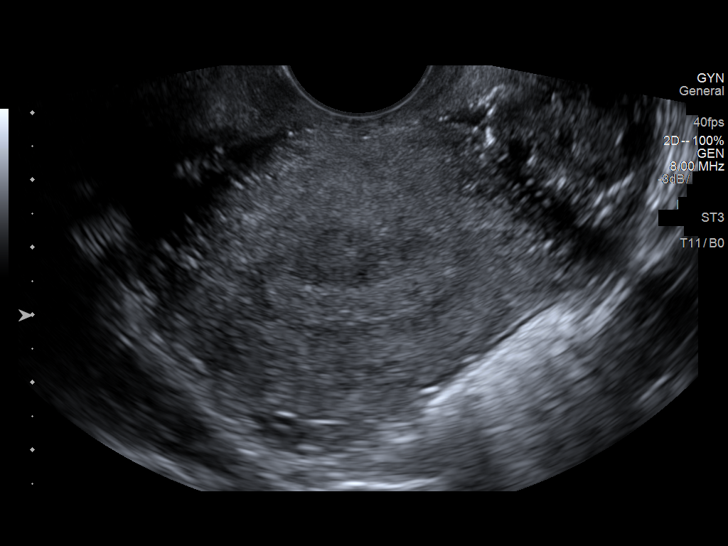
[im 58/100]
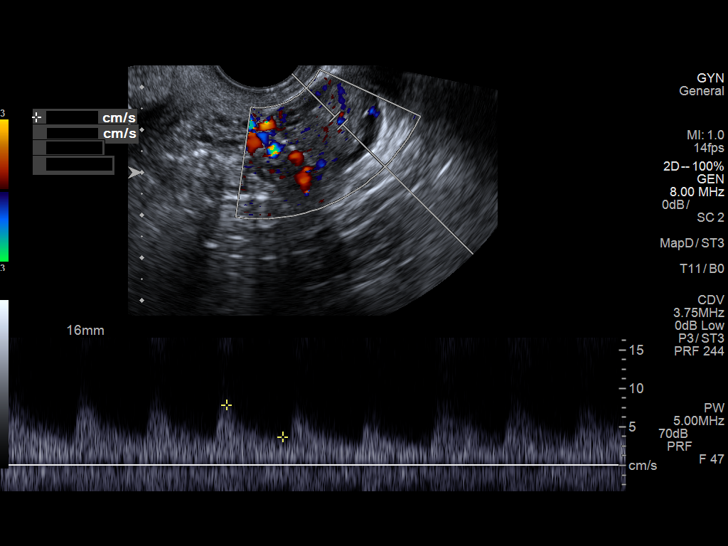
[im 67/100]
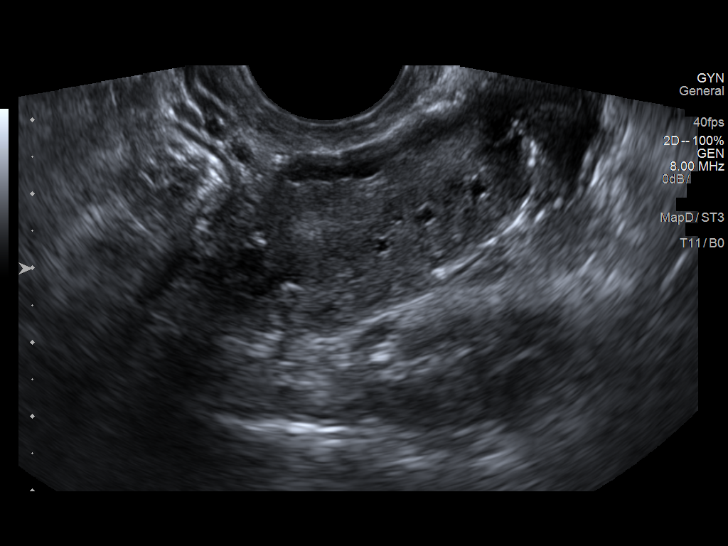
[im 75/100]
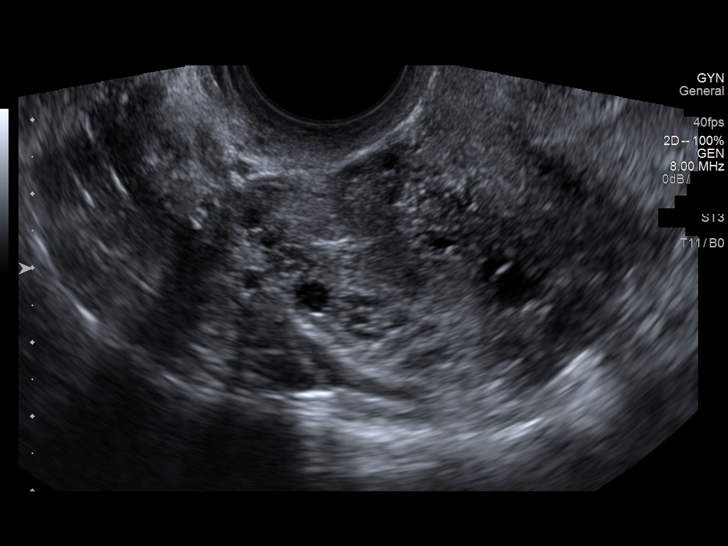
[im 83/100]
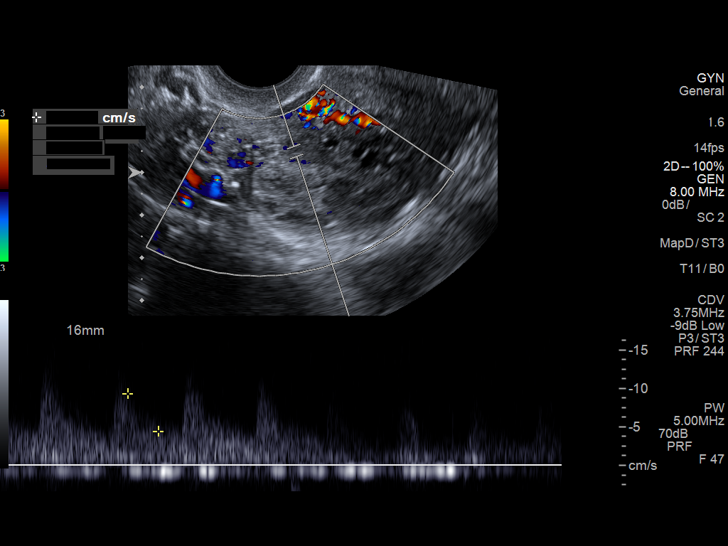
[im 91/100]
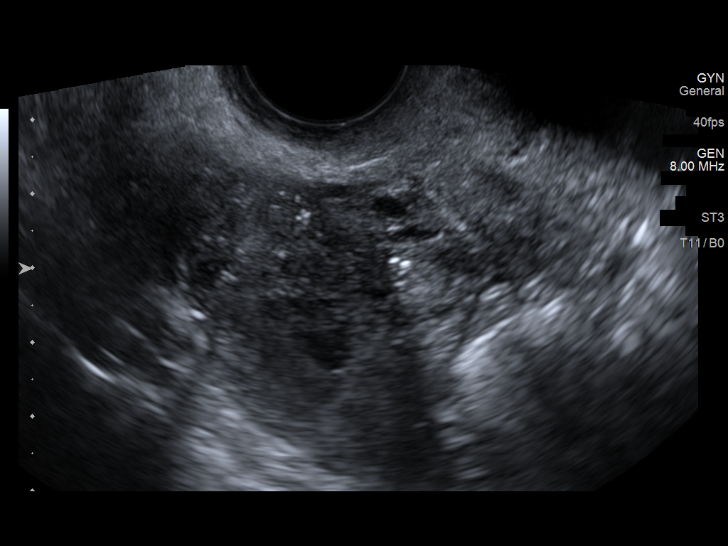
[im 100/100]
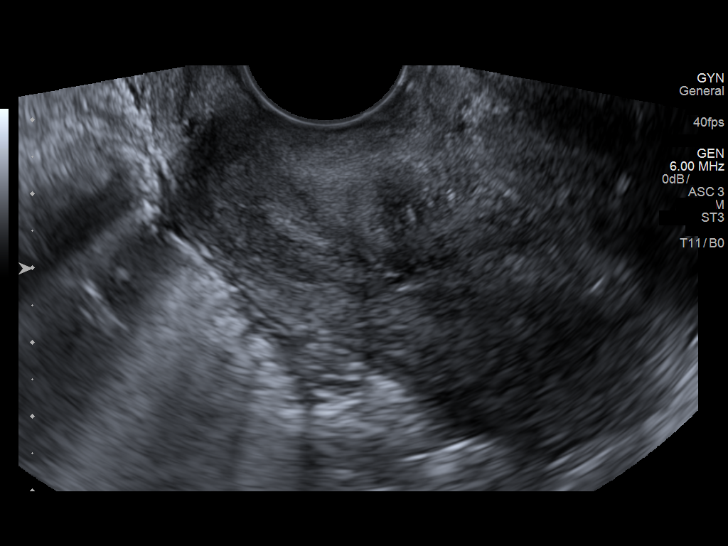

[13 of 25 positions shown; findings below may reference images not displayed]

FINDINGS: Uterus

Measurements: 7.5 x 4.6 x 5.4 cm. No fibroids or other mass
visualized. Uterus is retropositioned.

Endometrium

Thickness: 7 mm.  No focal abnormality visualized.

Right ovary

Measurements: 4.7 x 2.3 x 2.7 cm. There is a mixed echogenicity
focus in the right ovary measuring 2.9 x 2.6 x 2.7 cm, probably a
hemorrhagic cyst. No other right-sided pelvic mass.

Left ovary

Measurements: 4.3 x 1.8 x 2.5 cm. There is a probable smaller
hemorrhagic cyst on the left measuring 1.0 x 0.8 x 1.0 cm.

Pulsed Doppler evaluation of both ovaries demonstrates normal
low-resistance arterial and venous waveforms.

Other findings

No abnormal free fluid.
IMPRESSION: Probable hemorrhagic cysts bilaterally, larger on the right than on
the left. Short-interval follow up ultrasound in 6-12 weeks is
recommended, preferably during the week following the patient's
normal menses. No other pelvic masses. Endometrium is not thickened.
Note the uterus is retroverted. No ovarian torsion on either side.
